# Patient Record
Sex: Female | Born: 1955 | Race: Black or African American | Hispanic: No | Marital: Single | Smoking: Never smoker
Health system: Southern US, Community
[De-identification: ages and names within clinical notes are randomized; demographics above are authoritative.]

## PROBLEM LIST (undated history)

## (undated) DIAGNOSIS — E785 Hyperlipidemia, unspecified: Secondary | ICD-10-CM

## (undated) DIAGNOSIS — E049 Nontoxic goiter, unspecified: Secondary | ICD-10-CM

## (undated) DIAGNOSIS — K219 Gastro-esophageal reflux disease without esophagitis: Secondary | ICD-10-CM

## (undated) DIAGNOSIS — E119 Type 2 diabetes mellitus without complications: Secondary | ICD-10-CM

## (undated) DIAGNOSIS — I10 Essential (primary) hypertension: Secondary | ICD-10-CM

## (undated) HISTORY — PX: PARTIAL HYSTERECTOMY: SHX80

## (undated) HISTORY — DX: Gastro-esophageal reflux disease without esophagitis: K21.9

## (undated) HISTORY — DX: Essential (primary) hypertension: I10

## (undated) HISTORY — DX: Hyperlipidemia, unspecified: E78.5

## (undated) HISTORY — PX: LIPOMA EXCISION: SHX5283

## (undated) HISTORY — DX: Nontoxic goiter, unspecified: E04.9

## (undated) HISTORY — DX: Type 2 diabetes mellitus without complications: E11.9

---

## 1977-08-29 HISTORY — PX: APPENDECTOMY: SHX54

## 1999-06-23 ENCOUNTER — Encounter: Admission: RE | Admit: 1999-06-23 | Discharge: 1999-06-23 | Payer: Self-pay | Admitting: Family Medicine

## 1999-06-23 ENCOUNTER — Encounter: Payer: Self-pay | Admitting: Family Medicine

## 1999-07-19 ENCOUNTER — Encounter: Admission: RE | Admit: 1999-07-19 | Discharge: 1999-07-19 | Payer: Self-pay | Admitting: Family Medicine

## 1999-07-19 ENCOUNTER — Encounter: Payer: Self-pay | Admitting: Family Medicine

## 1999-11-17 ENCOUNTER — Encounter: Payer: Self-pay | Admitting: Otolaryngology

## 1999-11-17 ENCOUNTER — Ambulatory Visit (HOSPITAL_COMMUNITY): Admission: RE | Admit: 1999-11-17 | Discharge: 1999-11-17 | Payer: Self-pay | Admitting: Otolaryngology

## 2000-07-24 ENCOUNTER — Encounter: Payer: Self-pay | Admitting: Family Medicine

## 2000-07-24 ENCOUNTER — Encounter: Admission: RE | Admit: 2000-07-24 | Discharge: 2000-07-24 | Payer: Self-pay | Admitting: Family Medicine

## 2000-09-28 ENCOUNTER — Encounter: Admission: RE | Admit: 2000-09-28 | Discharge: 2000-09-28 | Payer: Self-pay | Admitting: Gastroenterology

## 2000-09-28 ENCOUNTER — Encounter: Payer: Self-pay | Admitting: Gastroenterology

## 2001-01-26 ENCOUNTER — Ambulatory Visit (HOSPITAL_COMMUNITY): Admission: RE | Admit: 2001-01-26 | Discharge: 2001-01-26 | Payer: Self-pay | Admitting: Gastroenterology

## 2001-08-14 ENCOUNTER — Encounter: Admission: RE | Admit: 2001-08-14 | Discharge: 2001-08-14 | Payer: Self-pay | Admitting: Family Medicine

## 2001-08-14 ENCOUNTER — Encounter: Payer: Self-pay | Admitting: Family Medicine

## 2002-09-16 ENCOUNTER — Encounter: Admission: RE | Admit: 2002-09-16 | Discharge: 2002-09-16 | Payer: Self-pay | Admitting: Family Medicine

## 2002-09-16 ENCOUNTER — Encounter: Payer: Self-pay | Admitting: Family Medicine

## 2003-05-19 ENCOUNTER — Encounter: Payer: Self-pay | Admitting: Gastroenterology

## 2003-05-19 ENCOUNTER — Ambulatory Visit (HOSPITAL_COMMUNITY): Admission: RE | Admit: 2003-05-19 | Discharge: 2003-05-19 | Payer: Self-pay | Admitting: Gastroenterology

## 2003-09-23 ENCOUNTER — Other Ambulatory Visit: Admission: RE | Admit: 2003-09-23 | Discharge: 2003-09-23 | Payer: Self-pay | Admitting: Family Medicine

## 2004-01-09 ENCOUNTER — Ambulatory Visit (HOSPITAL_COMMUNITY): Admission: RE | Admit: 2004-01-09 | Discharge: 2004-01-09 | Payer: Self-pay | Admitting: Family Medicine

## 2004-09-20 ENCOUNTER — Encounter: Admission: RE | Admit: 2004-09-20 | Discharge: 2004-09-20 | Payer: Self-pay | Admitting: Family Medicine

## 2004-09-30 ENCOUNTER — Other Ambulatory Visit: Admission: RE | Admit: 2004-09-30 | Discharge: 2004-09-30 | Payer: Self-pay | Admitting: Family Medicine

## 2005-01-06 ENCOUNTER — Encounter (INDEPENDENT_AMBULATORY_CARE_PROVIDER_SITE_OTHER): Payer: Self-pay | Admitting: *Deleted

## 2005-01-06 ENCOUNTER — Ambulatory Visit (HOSPITAL_COMMUNITY): Admission: RE | Admit: 2005-01-06 | Discharge: 2005-01-06 | Payer: Self-pay | Admitting: Obstetrics and Gynecology

## 2005-04-12 ENCOUNTER — Encounter (INDEPENDENT_AMBULATORY_CARE_PROVIDER_SITE_OTHER): Payer: Self-pay | Admitting: *Deleted

## 2005-04-12 ENCOUNTER — Inpatient Hospital Stay (HOSPITAL_COMMUNITY): Admission: RE | Admit: 2005-04-12 | Discharge: 2005-04-14 | Payer: Self-pay | Admitting: Obstetrics and Gynecology

## 2005-12-08 ENCOUNTER — Encounter: Admission: RE | Admit: 2005-12-08 | Discharge: 2005-12-08 | Payer: Self-pay | Admitting: Family Medicine

## 2005-12-28 ENCOUNTER — Encounter: Payer: Self-pay | Admitting: Family Medicine

## 2006-01-03 ENCOUNTER — Encounter: Admission: RE | Admit: 2006-01-03 | Discharge: 2006-01-03 | Payer: Self-pay | Admitting: Family Medicine

## 2006-01-11 ENCOUNTER — Ambulatory Visit (HOSPITAL_COMMUNITY): Admission: RE | Admit: 2006-01-11 | Discharge: 2006-01-11 | Payer: Self-pay | Admitting: Otolaryngology

## 2006-12-11 ENCOUNTER — Encounter: Admission: RE | Admit: 2006-12-11 | Discharge: 2006-12-11 | Payer: Self-pay | Admitting: Family Medicine

## 2007-11-12 ENCOUNTER — Other Ambulatory Visit: Admission: RE | Admit: 2007-11-12 | Discharge: 2007-11-12 | Payer: Self-pay | Admitting: Family Medicine

## 2008-01-10 ENCOUNTER — Encounter: Admission: RE | Admit: 2008-01-10 | Discharge: 2008-01-10 | Payer: Self-pay | Admitting: Family Medicine

## 2009-01-20 ENCOUNTER — Encounter: Admission: RE | Admit: 2009-01-20 | Discharge: 2009-01-20 | Payer: Self-pay | Admitting: Family Medicine

## 2010-02-09 ENCOUNTER — Encounter: Admission: RE | Admit: 2010-02-09 | Discharge: 2010-02-09 | Payer: Self-pay | Admitting: Family Medicine

## 2010-09-19 ENCOUNTER — Encounter: Payer: Self-pay | Admitting: Family Medicine

## 2011-01-14 NOTE — Op Note (Signed)
[name], [name]              ACCOUNT NO.:  [id number]   MEDICAL RECORD NO.:  [id number]          PATIENT TYPE:  AMB   LOCATION:  SDC                           [facility]ILITY:  [facility]   PHYSICIAN:  Charles A. Delcambre, MDDATE OF BIRTH:  [birth year]   DATE OF PROCEDURE:  01/06/2005  DATE OF DISCHARGE:                                 OPERATIVE REPORT   PREOPERATIVE DIAGNOSES:  1.  Irregular menses.  2.  Endometrial polyp.  3.  Endometrial hyperplasia.  4.  Prominent cervix of unclear etiology.   POSTOPERATIVE DIAGNOSES:  1.  Irregular menses.  2.  Endometrial polyp.  3.  Endometrial hyperplasia.  4.  Prominent cervix of unclear etiology.   PROCEDURES:  1.  Paracervical block.  2.  Hysteroscopy.  3.  Dilation and curettage.  4.  Endometrial polypectomy.  5.  Cervical biopsy.   SURGEON:  Charles A. Delcambre, MD   ASSISTANT:  None.   COMPLICATIONS:  None.   ESTIMATED BLOOD LOSS:  25 mL.   SPECIMENS:  1.  Endometrial curettings.  2.  Endometrial polyps.  3.  Endocervical curettings.  4.  Cervical biopsy, 12 o'clock random.   OPERATIVE FINDINGS:  Endocervical polyp, posterior fundus, noted.   Sorbitol loss during the procedure 10 mL.   ANESTHESIA:  Monitored anesthesia care with IV sedation.   DESCRIPTION OF PROCEDURE:  The patient was taken to the operating room and  placed in the supine position.  Anesthesia was dosed and the patient was  then placed in dorsal lithotomy position.  Sterile prep and drape was  undertaken, a weighted speculum was placed in the vagina, the anterior lip  of the cervix was grasped with a single-tooth tenaculum.  A paracervical  block was placed with 0.25% Marcaine plain at 4 and 8 o'clock, 5 mL at each  location.  No evidence of intravascular injection was noted.  Hanks dilators  were used to dilate it up enough to pass a 5 mm scope.  The scope was  passed.  The sound had previously been done prior to dilation.  It was noted  to 9 cm.   There was no evidence of perforation. Under direct visualization,  hysteroscopic findings were noted.  Polyp forceps were used to grasp the  polyps and a polypectomy was done without difficulty.  A moderate amount of  polyp tissue was removed with hysteroscopic guidance.  Following this, a  banjo curette was used without evidence of perforation for endometrial  curettings to be taken with a moderate amount of tissue obtained.  Hemostasis was adequate.  All instruments were removed and a 12 o'clock  random biopsy was done with Kevorkian biopsy forceps.  Monsel's solution was  held, the hemostasis was adequate.  All instruments were removed.  The  patient tolerated the procedure well and was taken to recovery with  physician in attendance, having tolerated her procedure well.      CAD/MEDQ  D:  01/06/2005  T:  01/06/2005  Job:  [id number]

## 2011-01-14 NOTE — Discharge Summary (Signed)
[name], [name]              ACCOUNT NO.:  [id number]   MEDICAL RECORD NO.:  [id number]          PATIENT TYPE:  INP   LOCATION:  9315                          [facility]ACILITY:  [facility]   PHYSICIAN:  Charles A. Delcambre, MDDATE OF BIRTH:  06/03/1956   DATE OF ADMISSION:  04/12/2005  DATE OF DISCHARGE:  04/14/2005                                 DISCHARGE SUMMARY   PRIMARY DISCHARGE DIAGNOSIS:  1.  Complex atypical hyperplasia.  2.  Adenomyosis.   OPERATION/PROCEDURE:  Transabdominal hysterectomy.   DISPOSITION:  The patient discharged home to follow up in the office in 48  hours to discontinue staples.  She was given convalescent instructions and  notify if any temperature greater than 100 degrees, incisional or erythema  or drainage, significant vaginal bleeding, increased pain.  She was to  convalesce at home.  No driving for two weeks.  No bath for two weeks.  Shower okay.  No heavy lifting greater than 25 pounds for one month.   DISCHARGE MEDICATIONS:  1.  Prescription for Percocet 5/325 mg 1-2 p.o. q.4h. p.r.n., #40.  2.  Hemacyte one p.o. daily, #30.   LABORATORY DATA:  Postoperative hematocrit 29.2, hemoglobin 9.7, pathology  returned as noted above diagnoses.   HISTORY AND PHYSICAL:  See hospital chart for dictated history and physical.   HOSPITAL COURSE:  The patient was admitted.  Underwent surgery as noted  above.  Postoperatively the patient had routine postoperative course.  She  had good pain control with PCA.  This was discontinued postoperative day #1  and p.o. medication was given.  Spontaneous return of flatus and toleration  of p.o. diet.  Clear liquids were initially tolerated.  She voided without  difficulty with discontinuation of Foley. Postoperative day #1 she  ambulated, tolerated general diet postoperative day #1 evening.  Continued  to do well postoperative day #2.  Was, therefore, discharged home on  postoperative day #2. Will followup as noted  above.      Charles A. Sydnee Cabal, MD  Electronically Signed     CAD/MEDQ  D:  05/14/2005  T:  05/14/2005  Job:  [id number]

## 2011-01-14 NOTE — H&P (Signed)
[name], [name]              ACCOUNT NO.:  [id number]   MEDICAL RECORD NO.:  [id number]           PATIENT TYPE:   LOCATION:                                 FACILITY:   PHYSICIAN:  Charles A. Delcambre, MDDATE OF BIRTH:  1956-02-15   DATE OF ADMISSION:  01/07/2004  DATE OF DISCHARGE:                                HISTORY & PHYSICAL   REASON FOR ADMISSION:  The patient is being admitted for hysteroscopy D&C  secondary to simple hyperplasia without atypia, to undergo hysteroscopy D&C.  She is a 55 year old para [id number], refusing further information.  LMP in December 2005.  Amenorrheic since that time.  Endometrial biopsy did  show focal simple hyperplasia without atypia.  Sonohystogram was obtained,  with the posterior with suggestion of a polyp noted with endometrial  thickness of 1.0 cm.   PAST MEDICAL HISTORY:  1.  Hypertension.  2.  GERD.   SURGICAL HISTORY:  1.  Lipoma removal.  2.  Appendix removal.   MEDICATIONS:  1.  Diovan hydrochlorothiazide 25 mg.  2.  Toprol-XL.  (Once a day each; otherwise, not specified doses.).   ALLERGIES:  No known drug allergies.   SOCIAL HISTORY:  No tobacco, ethanol, or drug use.  She is not married, not  sexually active.   FAMILY HISTORY:  Hypertension in father, mother, and 2 sisters.  One of the  sisters has kidney failure.   REVIEW OF SYSTEMS:  No fever, chills, rashes, lesions, headaches, dizziness,  seasonal allergies, chest pain, shortness of breath, wheezing, diarrhea,  constipation, bleeding, melena, hematochezia, urgency, frequency, dysuria,  incontinence, hematuria, __________ changes.   PHYSICAL EXAMINATION:  VITAL SIGNS:  Blood pressure 140/90, heart rate 60,  weight 199, respirations 18.  HEENT:  Grossly within normal limits.  NECK:  Supple without thyromegaly or adenopathy.  LUNGS:  Clear bilaterally.  HEART:  Regular rate and rhythm.  No murmurs, rubs, or gallops.  BREASTS:  Symmetrical.  The  remainder of the exam deferred, with normal exam  per primary care physician within the last 6 months, per patient.  ABDOMEN:  Soft, nontender.  PELVIC:  Normal external female genitalia.  Bartholin's urethra, Skene's  glands within normal limits.  Vault without discharge or lesions.  Uterus  not grossly enlarged.  Adnexal indeterminate secondary to patient's body  habitus.  I cannot detect ovaries directly.  The exam was nontender without  masses, however.   ASSESSMENT:  Irregular menses.  Simple hyperplasia on biopsy.   PLAN:  Hysteroscopy D&C.  CBC, Chem-7.  The patient was given the risks of  infection, bleeding, uterine perforation, bowel and bladder damage, blood  product risks including hepatitis and HIV exposure.  All questions were  answered.  Take blood pressure medications the morning of admission with sip  of water.  We will proceed as outlined.  Urine pregnancy test will be done  the morning of admission, as well.      CAD/MEDQ  D:  01/03/2005  T:  01/03/2005  Job:  [id number]

## 2011-01-14 NOTE — Op Note (Signed)
[name], [name]              ACCOUNT NO.:  [id number]   MEDICAL RECORD NO.:  [id number]          PATIENT TYPE:  INP   LOCATION:  9399                          [facility]ILITY[facility]  [facility]   PHYSICIAN:  Charles A. Delcambre, MDDATE OF BIRTH:  05/22/1956   DATE OF PROCEDURE:  04/12/2005  DATE OF DISCHARGE:                                 OPERATIVE REPORT   PREOPERATIVE DIAGNOSIS:  Complex endometrial hyperplasia.   POSTOPERATIVE DIAGNOSIS:  Complex endometrial hyperplasia.   PROCEDURE:  Transabdominal hysterectomy.   SURGEON:  Charles A. Sydnee Cabal, M.D.   ASSISTANT:  Rudy Jew. Ashley Royalty, M.D.   ANESTHESIA:  General by the endotracheal route.   ESTIMATED BLOOD LOSS:  500 mL.   SPECIMENS:  Uterus to pathology.   FINDINGS:  Normal tubes, ovaries, and uterus, and pelvis.  No external  lesions from the uterus grossly.   COUNTS:  Needle, sponge, and instrument counts correct x2.   DESCRIPTION OF PROCEDURE:  The patient was taken to the operating room and  placed in the supine position.  General anesthesia was induced without  difficulty and was adequate.  Sterile prep and drape was undertaken.  A  Pfannenstiel incision was made with the knife and taken down to the fascia.  The fascia was incised with the knife and Mayo scissors.  Rectus sheath was  released superiorly and inferiorly sharply.  Peritoneum was entered with the  Hemostat.  The rectus muscles were dissected in the midline.  The peritoneal  incision was taken superiorly and inferiorly sharply without damage to the  bowel or bladder.  Balfour retractor was placed in the abdomen for exposure.  Moistened laps were used to pack the bowel out of the pelvis. Cornual  regions were grasped with Kelly clamps.  Round ligaments were transected,  and transfixion stitched, and held.  The vesicouterine peritoneum was  incised across the lower uterine segment with Metzenbaum scissors and a  sponge stick was used to develop the bladder  development off the lower  uterine segment.  Utero-ovarian pedicles were isolated on either side, cross-  clamped, free tied, and then transfixion stitched with 0 Vicryl.  The  remaining pedicles were taken on either side with straight Heaney clamps,  transfixion stitched with good hemostasis.  Hemostasis was excellent and the  final views of the pedicles included the uterosacral ligaments and were  held.  Cervix was amputated from the vagina.  Richardson angle sutures were  placed on either angle with 0 Vicryl.  A 0 Vicryl running suture was then  placed in a locking fashion to close the vaginal cuff.  Hemostasis was  verified to be excellent.  Minor electrocautery was used on the anterior  cuff edge.  Several figure-of-eight 2-0 Vicryl sutures were used at the left  uterosacral ligament area at the vaginal angle and at an area near the  bladder as an anterior vaginal mucosa as well.  I could not detect any  bladder injury and did not feel the suture went into the bladder.  At one  point there was some significant bleeding from the peritoneal window just  medial  to the utero-ovarian pedicle on the right.  This was clamped with a  right angle and transfixion stitched with 2-0 Vicryl with good hemostasis  resulting.  Irrigation was carried out.  Hemostasis was verified.  All laps  and retractors were removed.  Subfascial hemostasis was excellent.  The  fascia was closed with #1 Vicryl running nonlocking suture.  Subcutaneous 2-  0 Vicryl interrupted suture was used to close the subcutaneous layer after  irrigation was carried out with minor electrocautery yielding good  hemostasis.  Sterile skin clips were used to close the skin.  The patient  tolerated the procedure well and was taken to the recovery room after  dressing was applied with the physician in attendance.      Charles A. Sydnee Cabal, MD  Electronically Signed     CAD/MEDQ  D:  04/12/2005  T:  04/12/2005  Job:  [id number]

## 2011-01-14 NOTE — H&P (Signed)
[name], [name]              ACCOUNT NO.:  [id number]   MEDICAL RECORD NO.:  [id number]          PATIENT TYPE:  INP   LOCATION:  NA                            [facility]   PHYSICIAN:  Charles A. Delcambre, MDDATE OF BIRTH:  May 22, 1956   DATE OF ADMISSION:  04/12/2005  DATE OF DISCHARGE:                                HISTORY & PHYSICAL   Patient is to be admitted on 04/12/2005 to undergo transabdominal  hysterectomy, possible bilateral salpingo-oophorectomy, but wishing to  conserve ovaries if possible.   INDICATION FOR SURGERY:  Complex endometrial hyperplasia with abnormal  uterine bleeding.  She has informed consent.  Discussed risks of infection,  bleeding, bowel or bladder damage, blood product risks including hepatitis  and HIV exposure, and DVT risks, incision cellulitis and infection, ureteral  damage.  All questions are answered.  She gives informed consent.   PAST MEDICAL HISTORY:  1.  Hypertension.  2.  GERD.   PAST SURGICAL HISTORY:  1.  Lipoma removal.  2.  Appendectomy.  3.  Hysteroscopy/D&C.   MEDICATIONS:  1.  Diovan/hydrochlorothiazide 25 mg once a day.  2.  Toprol XL.  3.  Nexium dose not specified.   ALLERGIES:  No known drug allergies.   SOCIAL HISTORY:  Denies tobacco, ethanol, or drug use or STD exposure in the  past.  She is not married, currently not sexually active.   FAMILY HISTORY:  Father is 39, mother age 83.  Each with hypertension.  Sister 50, sister age 46, each with hypertension.  Second sister with kidney  failure, sister age 69 in good health.   REVIEW OF SYSTEMS:  Cervical polyps, endometrial polyps, irregular menses,  and endometrial hyperplasia, complex, as noted above.  Otherwise, denies  fever, chills, nausea, vomiting, diarrhea, constipation, headaches, bleeding  per rectum, galactorrhea, emotional changes, urgency, frequency, dysuria,  hematuria.   PHYSICAL EXAMINATION:  GENERAL:  Alert and oriented x3.  No  distress.  VITAL SIGNS:  Blood pressure 140/90, heart rate 66, weight 198 pounds.  HEENT:  Grossly within normal limits.  NECK:  Supple without thyromegaly or adenopathy.  LUNGS:  Clear bilaterally.  BACK:  No CVAT.  Vertebral column nontender to palpation.  HEART:  Regular rate and rhythm without murmurs, rubs, or gallops.  BREASTS:  No masses, tenderness, or discharge.  SKIN:  No nipple change bilaterally.  ABDOMEN:  Moderate obesity present.  Appendectomy scar is noted.  No  hepatosplenomegaly or other masses noted.  PELVIC:  Normal external female genitalia.  Bartholin's, urethral, and  Skene's within normal limits.  Vault without discharge or lesions.  Multiparous cervix is noted.  Vagina is normal.  Bimanual examination:  Uterus not grossly enlarged.  Adnexa nontender without masses bilaterally.  Ovaries difficult to palpate secondary to patient's body habitus.   ASSESSMENT:  1.  Endometrial hyperplasia, complex.  2.  Irregular bleeding.   PLAN:  Transabdominal hysterectomy, bilateral salpingo-oophorectomy only if  indicated by surgical findings.  Patient prefers ovarian conservation.  Informed consent and counseling as noted above.  Patient gives informed  consent with regard to ovarian conservation,  possible increased risk of  ovarian cancer later in life.  All questions are answered.  Preoperative CBC  and chem-20, type and screen, PAS, incentive spirometry, and SCD's are all  ordered.  We will use Cefotan 1 g IV on-call to the OR.  All questions are  answered.  We will proceed as outlined.       CAD/MEDQ  D:  04/04/2005  T:  04/04/2005  Job:  [id number]

## 2011-02-28 ENCOUNTER — Other Ambulatory Visit: Payer: Self-pay | Admitting: Family Medicine

## 2011-02-28 DIAGNOSIS — Z1231 Encounter for screening mammogram for malignant neoplasm of breast: Secondary | ICD-10-CM

## 2011-03-16 ENCOUNTER — Ambulatory Visit
Admission: RE | Admit: 2011-03-16 | Discharge: 2011-03-16 | Disposition: A | Discharge status: Home / Self Care | Payer: Federal, State, Local not specified - PPO | Source: Ambulatory Visit | Attending: Family Medicine | Admitting: Family Medicine

## 2011-03-16 DIAGNOSIS — Z1231 Encounter for screening mammogram for malignant neoplasm of breast: Secondary | ICD-10-CM

## 2012-02-15 ENCOUNTER — Other Ambulatory Visit: Payer: Self-pay | Admitting: Family Medicine

## 2012-02-15 DIAGNOSIS — Z1231 Encounter for screening mammogram for malignant neoplasm of breast: Secondary | ICD-10-CM

## 2012-03-20 ENCOUNTER — Ambulatory Visit
Admission: RE | Admit: 2012-03-20 | Discharge: 2012-03-20 | Disposition: A | Discharge status: Home / Self Care | Payer: Federal, State, Local not specified - PPO | Source: Ambulatory Visit | Attending: Family Medicine | Admitting: Family Medicine

## 2012-03-20 DIAGNOSIS — Z1231 Encounter for screening mammogram for malignant neoplasm of breast: Secondary | ICD-10-CM

## 2013-02-11 ENCOUNTER — Other Ambulatory Visit: Payer: Self-pay

## 2013-02-11 DIAGNOSIS — Z1231 Encounter for screening mammogram for malignant neoplasm of breast: Secondary | ICD-10-CM

## 2013-03-21 ENCOUNTER — Ambulatory Visit
Admission: RE | Admit: 2013-03-21 | Discharge: 2013-03-21 | Disposition: A | Discharge status: Home / Self Care | Payer: Federal, State, Local not specified - PPO | Source: Ambulatory Visit

## 2013-03-21 ENCOUNTER — Ambulatory Visit: Payer: Federal, State, Local not specified - PPO

## 2013-03-21 DIAGNOSIS — Z1231 Encounter for screening mammogram for malignant neoplasm of breast: Secondary | ICD-10-CM

## 2014-02-18 ENCOUNTER — Other Ambulatory Visit: Payer: Self-pay

## 2014-02-18 DIAGNOSIS — Z1231 Encounter for screening mammogram for malignant neoplasm of breast: Secondary | ICD-10-CM

## 2014-03-26 ENCOUNTER — Ambulatory Visit
Admission: RE | Admit: 2014-03-26 | Discharge: 2014-03-26 | Disposition: A | Discharge status: Home / Self Care | Payer: Federal, State, Local not specified - PPO | Source: Ambulatory Visit

## 2014-03-26 ENCOUNTER — Encounter (INDEPENDENT_AMBULATORY_CARE_PROVIDER_SITE_OTHER): Payer: Self-pay

## 2014-03-26 DIAGNOSIS — Z1231 Encounter for screening mammogram for malignant neoplasm of breast: Secondary | ICD-10-CM

## 2015-03-13 ENCOUNTER — Other Ambulatory Visit: Payer: Self-pay

## 2015-03-13 DIAGNOSIS — Z1231 Encounter for screening mammogram for malignant neoplasm of breast: Secondary | ICD-10-CM

## 2015-04-02 ENCOUNTER — Ambulatory Visit
Admission: RE | Admit: 2015-04-02 | Discharge: 2015-04-02 | Disposition: A | Discharge status: Home / Self Care | Payer: Federal, State, Local not specified - PPO | Source: Ambulatory Visit

## 2015-04-02 DIAGNOSIS — Z1231 Encounter for screening mammogram for malignant neoplasm of breast: Secondary | ICD-10-CM

## 2016-03-14 ENCOUNTER — Other Ambulatory Visit: Payer: Self-pay | Admitting: Family Medicine

## 2016-03-14 DIAGNOSIS — Z1231 Encounter for screening mammogram for malignant neoplasm of breast: Secondary | ICD-10-CM

## 2016-04-06 DIAGNOSIS — K080 Exfoliation of teeth due to systemic causes: Secondary | ICD-10-CM | POA: Diagnosis not present

## 2016-04-11 DIAGNOSIS — E7800 Pure hypercholesterolemia, unspecified: Secondary | ICD-10-CM | POA: Diagnosis not present

## 2016-04-11 DIAGNOSIS — E042 Nontoxic multinodular goiter: Secondary | ICD-10-CM | POA: Diagnosis not present

## 2016-04-11 DIAGNOSIS — E119 Type 2 diabetes mellitus without complications: Secondary | ICD-10-CM | POA: Diagnosis not present

## 2016-04-11 DIAGNOSIS — I10 Essential (primary) hypertension: Secondary | ICD-10-CM | POA: Diagnosis not present

## 2016-04-12 ENCOUNTER — Ambulatory Visit: Payer: Federal, State, Local not specified - PPO

## 2016-04-20 ENCOUNTER — Ambulatory Visit: Payer: Federal, State, Local not specified - PPO

## 2016-04-22 ENCOUNTER — Ambulatory Visit: Payer: Federal, State, Local not specified - PPO

## 2016-05-16 ENCOUNTER — Ambulatory Visit
Admission: RE | Admit: 2016-05-16 | Discharge: 2016-05-16 | Disposition: A | Discharge status: Home / Self Care | Payer: Federal, State, Local not specified - PPO | Source: Ambulatory Visit | Attending: Family Medicine | Admitting: Family Medicine

## 2016-05-16 DIAGNOSIS — Z1231 Encounter for screening mammogram for malignant neoplasm of breast: Secondary | ICD-10-CM

## 2016-07-08 DIAGNOSIS — E7800 Pure hypercholesterolemia, unspecified: Secondary | ICD-10-CM | POA: Diagnosis not present

## 2016-10-17 DIAGNOSIS — E042 Nontoxic multinodular goiter: Secondary | ICD-10-CM | POA: Diagnosis not present

## 2016-10-17 DIAGNOSIS — E119 Type 2 diabetes mellitus without complications: Secondary | ICD-10-CM | POA: Diagnosis not present

## 2016-10-17 DIAGNOSIS — I10 Essential (primary) hypertension: Secondary | ICD-10-CM | POA: Diagnosis not present

## 2016-10-17 DIAGNOSIS — E7800 Pure hypercholesterolemia, unspecified: Secondary | ICD-10-CM | POA: Diagnosis not present

## 2017-04-14 DIAGNOSIS — E119 Type 2 diabetes mellitus without complications: Secondary | ICD-10-CM | POA: Diagnosis not present

## 2017-04-14 DIAGNOSIS — I10 Essential (primary) hypertension: Secondary | ICD-10-CM | POA: Diagnosis not present

## 2017-04-14 DIAGNOSIS — E042 Nontoxic multinodular goiter: Secondary | ICD-10-CM | POA: Diagnosis not present

## 2017-04-14 DIAGNOSIS — E7800 Pure hypercholesterolemia, unspecified: Secondary | ICD-10-CM | POA: Diagnosis not present

## 2017-04-17 ENCOUNTER — Other Ambulatory Visit: Payer: Self-pay | Admitting: Family Medicine

## 2017-04-17 DIAGNOSIS — Z1231 Encounter for screening mammogram for malignant neoplasm of breast: Secondary | ICD-10-CM

## 2017-04-18 DIAGNOSIS — K080 Exfoliation of teeth due to systemic causes: Secondary | ICD-10-CM | POA: Diagnosis not present

## 2017-05-17 ENCOUNTER — Ambulatory Visit
Admission: RE | Admit: 2017-05-17 | Discharge: 2017-05-17 | Disposition: A | Discharge status: Home / Self Care | Payer: Federal, State, Local not specified - PPO | Source: Ambulatory Visit | Attending: Family Medicine | Admitting: Family Medicine

## 2017-05-17 DIAGNOSIS — Z1231 Encounter for screening mammogram for malignant neoplasm of breast: Secondary | ICD-10-CM

## 2017-06-23 DIAGNOSIS — L02211 Cutaneous abscess of abdominal wall: Secondary | ICD-10-CM | POA: Diagnosis not present

## 2017-10-24 DIAGNOSIS — K080 Exfoliation of teeth due to systemic causes: Secondary | ICD-10-CM | POA: Diagnosis not present

## 2017-11-24 DIAGNOSIS — I10 Essential (primary) hypertension: Secondary | ICD-10-CM | POA: Diagnosis not present

## 2017-11-24 DIAGNOSIS — E7800 Pure hypercholesterolemia, unspecified: Secondary | ICD-10-CM | POA: Diagnosis not present

## 2017-11-24 DIAGNOSIS — K219 Gastro-esophageal reflux disease without esophagitis: Secondary | ICD-10-CM | POA: Diagnosis not present

## 2017-11-24 DIAGNOSIS — E119 Type 2 diabetes mellitus without complications: Secondary | ICD-10-CM | POA: Diagnosis not present

## 2018-03-21 DIAGNOSIS — H903 Sensorineural hearing loss, bilateral: Secondary | ICD-10-CM | POA: Diagnosis not present

## 2018-04-04 DIAGNOSIS — Z1211 Encounter for screening for malignant neoplasm of colon: Secondary | ICD-10-CM | POA: Diagnosis not present

## 2018-04-04 DIAGNOSIS — D126 Benign neoplasm of colon, unspecified: Secondary | ICD-10-CM | POA: Diagnosis not present

## 2018-04-04 DIAGNOSIS — K635 Polyp of colon: Secondary | ICD-10-CM | POA: Diagnosis not present

## 2018-04-06 DIAGNOSIS — K635 Polyp of colon: Secondary | ICD-10-CM | POA: Diagnosis not present

## 2018-04-06 DIAGNOSIS — Z1211 Encounter for screening for malignant neoplasm of colon: Secondary | ICD-10-CM | POA: Diagnosis not present

## 2018-04-06 DIAGNOSIS — D126 Benign neoplasm of colon, unspecified: Secondary | ICD-10-CM | POA: Diagnosis not present

## 2018-04-10 ENCOUNTER — Other Ambulatory Visit: Payer: Self-pay | Admitting: Family Medicine

## 2018-04-10 DIAGNOSIS — Z1231 Encounter for screening mammogram for malignant neoplasm of breast: Secondary | ICD-10-CM

## 2018-05-02 DIAGNOSIS — K080 Exfoliation of teeth due to systemic causes: Secondary | ICD-10-CM | POA: Diagnosis not present

## 2018-05-17 DIAGNOSIS — M9902 Segmental and somatic dysfunction of thoracic region: Secondary | ICD-10-CM | POA: Diagnosis not present

## 2018-05-17 DIAGNOSIS — M546 Pain in thoracic spine: Secondary | ICD-10-CM | POA: Diagnosis not present

## 2018-05-17 DIAGNOSIS — R293 Abnormal posture: Secondary | ICD-10-CM | POA: Diagnosis not present

## 2018-05-17 DIAGNOSIS — M2560 Stiffness of unspecified joint, not elsewhere classified: Secondary | ICD-10-CM | POA: Diagnosis not present

## 2018-05-22 ENCOUNTER — Ambulatory Visit
Admission: RE | Admit: 2018-05-22 | Discharge: 2018-05-22 | Disposition: A | Discharge status: Home / Self Care | Payer: Federal, State, Local not specified - PPO | Source: Ambulatory Visit | Attending: Family Medicine | Admitting: Family Medicine

## 2018-05-22 DIAGNOSIS — Z1231 Encounter for screening mammogram for malignant neoplasm of breast: Secondary | ICD-10-CM | POA: Diagnosis not present

## 2018-05-28 DIAGNOSIS — M546 Pain in thoracic spine: Secondary | ICD-10-CM | POA: Diagnosis not present

## 2018-05-28 DIAGNOSIS — M2560 Stiffness of unspecified joint, not elsewhere classified: Secondary | ICD-10-CM | POA: Diagnosis not present

## 2018-05-28 DIAGNOSIS — M9902 Segmental and somatic dysfunction of thoracic region: Secondary | ICD-10-CM | POA: Diagnosis not present

## 2018-05-28 DIAGNOSIS — R293 Abnormal posture: Secondary | ICD-10-CM | POA: Diagnosis not present

## 2018-06-01 DIAGNOSIS — I10 Essential (primary) hypertension: Secondary | ICD-10-CM | POA: Diagnosis not present

## 2018-06-01 DIAGNOSIS — E7800 Pure hypercholesterolemia, unspecified: Secondary | ICD-10-CM | POA: Diagnosis not present

## 2018-06-01 DIAGNOSIS — E042 Nontoxic multinodular goiter: Secondary | ICD-10-CM | POA: Diagnosis not present

## 2018-06-01 DIAGNOSIS — E119 Type 2 diabetes mellitus without complications: Secondary | ICD-10-CM | POA: Diagnosis not present

## 2018-06-11 DIAGNOSIS — M546 Pain in thoracic spine: Secondary | ICD-10-CM | POA: Diagnosis not present

## 2018-06-11 DIAGNOSIS — R293 Abnormal posture: Secondary | ICD-10-CM | POA: Diagnosis not present

## 2018-06-11 DIAGNOSIS — M2560 Stiffness of unspecified joint, not elsewhere classified: Secondary | ICD-10-CM | POA: Diagnosis not present

## 2018-06-11 DIAGNOSIS — M9902 Segmental and somatic dysfunction of thoracic region: Secondary | ICD-10-CM | POA: Diagnosis not present

## 2018-06-12 DIAGNOSIS — K080 Exfoliation of teeth due to systemic causes: Secondary | ICD-10-CM | POA: Diagnosis not present

## 2018-06-13 DIAGNOSIS — R293 Abnormal posture: Secondary | ICD-10-CM | POA: Diagnosis not present

## 2018-06-13 DIAGNOSIS — M2560 Stiffness of unspecified joint, not elsewhere classified: Secondary | ICD-10-CM | POA: Diagnosis not present

## 2018-06-13 DIAGNOSIS — M546 Pain in thoracic spine: Secondary | ICD-10-CM | POA: Diagnosis not present

## 2018-06-13 DIAGNOSIS — M9902 Segmental and somatic dysfunction of thoracic region: Secondary | ICD-10-CM | POA: Diagnosis not present

## 2018-06-15 DIAGNOSIS — M2560 Stiffness of unspecified joint, not elsewhere classified: Secondary | ICD-10-CM | POA: Diagnosis not present

## 2018-06-15 DIAGNOSIS — R293 Abnormal posture: Secondary | ICD-10-CM | POA: Diagnosis not present

## 2018-06-15 DIAGNOSIS — M9902 Segmental and somatic dysfunction of thoracic region: Secondary | ICD-10-CM | POA: Diagnosis not present

## 2018-06-15 DIAGNOSIS — M546 Pain in thoracic spine: Secondary | ICD-10-CM | POA: Diagnosis not present

## 2018-06-18 DIAGNOSIS — M9902 Segmental and somatic dysfunction of thoracic region: Secondary | ICD-10-CM | POA: Diagnosis not present

## 2018-06-18 DIAGNOSIS — M546 Pain in thoracic spine: Secondary | ICD-10-CM | POA: Diagnosis not present

## 2018-06-18 DIAGNOSIS — R293 Abnormal posture: Secondary | ICD-10-CM | POA: Diagnosis not present

## 2018-06-18 DIAGNOSIS — M2560 Stiffness of unspecified joint, not elsewhere classified: Secondary | ICD-10-CM | POA: Diagnosis not present

## 2018-06-20 DIAGNOSIS — R293 Abnormal posture: Secondary | ICD-10-CM | POA: Diagnosis not present

## 2018-06-20 DIAGNOSIS — M546 Pain in thoracic spine: Secondary | ICD-10-CM | POA: Diagnosis not present

## 2018-06-20 DIAGNOSIS — M9902 Segmental and somatic dysfunction of thoracic region: Secondary | ICD-10-CM | POA: Diagnosis not present

## 2018-06-20 DIAGNOSIS — M2560 Stiffness of unspecified joint, not elsewhere classified: Secondary | ICD-10-CM | POA: Diagnosis not present

## 2018-07-04 DIAGNOSIS — J343 Hypertrophy of nasal turbinates: Secondary | ICD-10-CM | POA: Diagnosis not present

## 2018-07-04 DIAGNOSIS — J342 Deviated nasal septum: Secondary | ICD-10-CM | POA: Diagnosis not present

## 2018-07-04 DIAGNOSIS — J310 Chronic rhinitis: Secondary | ICD-10-CM | POA: Diagnosis not present

## 2018-08-08 DIAGNOSIS — K080 Exfoliation of teeth due to systemic causes: Secondary | ICD-10-CM | POA: Diagnosis not present

## 2018-12-14 DIAGNOSIS — E042 Nontoxic multinodular goiter: Secondary | ICD-10-CM | POA: Diagnosis not present

## 2018-12-14 DIAGNOSIS — E119 Type 2 diabetes mellitus without complications: Secondary | ICD-10-CM | POA: Diagnosis not present

## 2018-12-14 DIAGNOSIS — I10 Essential (primary) hypertension: Secondary | ICD-10-CM | POA: Diagnosis not present

## 2018-12-14 DIAGNOSIS — E7800 Pure hypercholesterolemia, unspecified: Secondary | ICD-10-CM | POA: Diagnosis not present

## 2018-12-27 DIAGNOSIS — E559 Vitamin D deficiency, unspecified: Secondary | ICD-10-CM | POA: Diagnosis not present

## 2018-12-27 DIAGNOSIS — I10 Essential (primary) hypertension: Secondary | ICD-10-CM | POA: Diagnosis not present

## 2018-12-27 DIAGNOSIS — E119 Type 2 diabetes mellitus without complications: Secondary | ICD-10-CM | POA: Diagnosis not present

## 2018-12-27 DIAGNOSIS — E7800 Pure hypercholesterolemia, unspecified: Secondary | ICD-10-CM | POA: Diagnosis not present

## 2019-04-19 ENCOUNTER — Other Ambulatory Visit: Payer: Self-pay | Admitting: Family Medicine

## 2019-04-19 DIAGNOSIS — Z1231 Encounter for screening mammogram for malignant neoplasm of breast: Secondary | ICD-10-CM

## 2019-06-13 ENCOUNTER — Ambulatory Visit: Payer: Federal, State, Local not specified - PPO

## 2019-06-13 DIAGNOSIS — E7800 Pure hypercholesterolemia, unspecified: Secondary | ICD-10-CM | POA: Diagnosis not present

## 2019-06-13 DIAGNOSIS — I10 Essential (primary) hypertension: Secondary | ICD-10-CM | POA: Diagnosis not present

## 2019-06-13 DIAGNOSIS — E042 Nontoxic multinodular goiter: Secondary | ICD-10-CM | POA: Diagnosis not present

## 2019-06-13 DIAGNOSIS — E119 Type 2 diabetes mellitus without complications: Secondary | ICD-10-CM | POA: Diagnosis not present

## 2019-06-25 ENCOUNTER — Ambulatory Visit
Admission: RE | Admit: 2019-06-25 | Discharge: 2019-06-25 | Disposition: A | Discharge status: Home / Self Care | Payer: Federal, State, Local not specified - PPO | Source: Ambulatory Visit | Attending: Family Medicine | Admitting: Family Medicine

## 2019-06-25 ENCOUNTER — Other Ambulatory Visit: Payer: Self-pay

## 2019-06-25 DIAGNOSIS — Z1231 Encounter for screening mammogram for malignant neoplasm of breast: Secondary | ICD-10-CM

## 2019-06-27 DIAGNOSIS — E119 Type 2 diabetes mellitus without complications: Secondary | ICD-10-CM | POA: Diagnosis not present

## 2019-12-03 DIAGNOSIS — E119 Type 2 diabetes mellitus without complications: Secondary | ICD-10-CM | POA: Diagnosis not present

## 2019-12-03 DIAGNOSIS — E042 Nontoxic multinodular goiter: Secondary | ICD-10-CM | POA: Diagnosis not present

## 2019-12-03 DIAGNOSIS — E7800 Pure hypercholesterolemia, unspecified: Secondary | ICD-10-CM | POA: Diagnosis not present

## 2019-12-03 DIAGNOSIS — I10 Essential (primary) hypertension: Secondary | ICD-10-CM | POA: Diagnosis not present

## 2020-05-25 ENCOUNTER — Other Ambulatory Visit: Payer: Self-pay | Admitting: Family Medicine

## 2020-05-25 DIAGNOSIS — Z1231 Encounter for screening mammogram for malignant neoplasm of breast: Secondary | ICD-10-CM

## 2020-06-05 DIAGNOSIS — I10 Essential (primary) hypertension: Secondary | ICD-10-CM | POA: Diagnosis not present

## 2020-06-05 DIAGNOSIS — K2100 Gastro-esophageal reflux disease with esophagitis, without bleeding: Secondary | ICD-10-CM | POA: Diagnosis not present

## 2020-06-05 DIAGNOSIS — E119 Type 2 diabetes mellitus without complications: Secondary | ICD-10-CM | POA: Diagnosis not present

## 2020-06-05 DIAGNOSIS — E7800 Pure hypercholesterolemia, unspecified: Secondary | ICD-10-CM | POA: Diagnosis not present

## 2020-06-25 ENCOUNTER — Ambulatory Visit: Payer: Federal, State, Local not specified - PPO

## 2020-07-30 ENCOUNTER — Other Ambulatory Visit: Payer: Self-pay

## 2020-07-30 ENCOUNTER — Ambulatory Visit
Admission: RE | Admit: 2020-07-30 | Discharge: 2020-07-30 | Disposition: A | Discharge status: Home / Self Care | Payer: Federal, State, Local not specified - PPO | Source: Ambulatory Visit | Attending: Family Medicine | Admitting: Family Medicine

## 2020-07-30 DIAGNOSIS — Z1231 Encounter for screening mammogram for malignant neoplasm of breast: Secondary | ICD-10-CM | POA: Diagnosis not present

## 2020-12-10 DIAGNOSIS — E7800 Pure hypercholesterolemia, unspecified: Secondary | ICD-10-CM | POA: Diagnosis not present

## 2020-12-10 DIAGNOSIS — E119 Type 2 diabetes mellitus without complications: Secondary | ICD-10-CM | POA: Diagnosis not present

## 2020-12-10 DIAGNOSIS — E042 Nontoxic multinodular goiter: Secondary | ICD-10-CM | POA: Diagnosis not present

## 2020-12-10 DIAGNOSIS — I10 Essential (primary) hypertension: Secondary | ICD-10-CM | POA: Diagnosis not present

## 2020-12-17 ENCOUNTER — Telehealth: Payer: Self-pay | Admitting: Physician Assistant

## 2020-12-17 NOTE — Telephone Encounter (Signed)
Received a new hem referral from Dr. Tiburcio Pea for decreased wbc. Ms. bora has been cld and scheduled to see Karena Addison on 4/27 at 2pm. Pt aware to arrive 20 minutes early.

## 2020-12-23 ENCOUNTER — Encounter: Payer: Self-pay | Admitting: Physician Assistant

## 2020-12-23 ENCOUNTER — Other Ambulatory Visit: Payer: Self-pay

## 2020-12-23 ENCOUNTER — Inpatient Hospital Stay: Payer: Federal, State, Local not specified - PPO | Attending: Physician Assistant | Admitting: Physician Assistant

## 2020-12-23 ENCOUNTER — Inpatient Hospital Stay: Payer: Federal, State, Local not specified - PPO

## 2020-12-23 DIAGNOSIS — R0602 Shortness of breath: Secondary | ICD-10-CM | POA: Diagnosis not present

## 2020-12-23 DIAGNOSIS — E119 Type 2 diabetes mellitus without complications: Secondary | ICD-10-CM | POA: Diagnosis not present

## 2020-12-23 DIAGNOSIS — D709 Neutropenia, unspecified: Secondary | ICD-10-CM | POA: Insufficient documentation

## 2020-12-23 DIAGNOSIS — K219 Gastro-esophageal reflux disease without esophagitis: Secondary | ICD-10-CM | POA: Diagnosis not present

## 2020-12-23 DIAGNOSIS — D708 Other neutropenia: Secondary | ICD-10-CM | POA: Diagnosis not present

## 2020-12-23 DIAGNOSIS — I10 Essential (primary) hypertension: Secondary | ICD-10-CM | POA: Diagnosis not present

## 2020-12-23 DIAGNOSIS — E785 Hyperlipidemia, unspecified: Secondary | ICD-10-CM | POA: Diagnosis not present

## 2020-12-23 LAB — CMP (CANCER CENTER ONLY)
ALT: 20 U/L (ref 0–44)
AST: 24 U/L (ref 15–41)
Albumin: 4.6 g/dL (ref 3.5–5.0)
Alkaline Phosphatase: 43 U/L (ref 38–126)
Anion gap: 11 (ref 5–15)
BUN: 24 mg/dL — ABNORMAL HIGH (ref 8–23)
CO2: 25 mmol/L (ref 22–32)
Calcium: 9.8 mg/dL (ref 8.9–10.3)
Chloride: 105 mmol/L (ref 98–111)
Creatinine: 1 mg/dL (ref 0.44–1.00)
GFR, Estimated: >60 mL/min (ref >60)
Glucose, Bld: 102 mg/dL — ABNORMAL HIGH (ref 70–99)
Potassium: 4.3 mmol/L (ref 3.5–5.1)
Sodium: 141 mmol/L (ref 135–145)
Total Bilirubin: 0.7 mg/dL (ref 0.3–1.2)
Total Protein: 8.6 g/dL — ABNORMAL HIGH (ref 6.5–8.1)

## 2020-12-23 LAB — CBC WITH DIFFERENTIAL (CANCER CENTER ONLY)
Abs Immature Granulocytes: 0.01 10*3/uL (ref 0.00–0.07)
Basophils Absolute: 0 10*3/uL (ref 0.0–0.1)
Basophils Relative: 1 %
Eosinophils Absolute: 0.1 10*3/uL (ref 0.0–0.5)
Eosinophils Relative: 3 %
HCT: 43.8 % (ref 36.0–46.0)
Hemoglobin: 14.9 g/dL (ref 12.0–15.0)
Immature Granulocytes: 0 %
Lymphocytes Relative: 47 %
Lymphs Abs: 1.5 10*3/uL (ref 0.7–4.0)
MCH: 29.7 pg (ref 26.0–34.0)
MCHC: 34 g/dL (ref 30.0–36.0)
MCV: 87.3 fL (ref 80.0–100.0)
Monocytes Absolute: 0.3 10*3/uL (ref 0.1–1.0)
Monocytes Relative: 10 %
Neutro Abs: 1.2 10*3/uL — ABNORMAL LOW (ref 1.7–7.7)
Neutrophils Relative %: 39 %
Platelet Count: 252 10*3/uL (ref 150–400)
RBC: 5.02 MIL/uL (ref 3.87–5.11)
RDW: 12.4 % (ref 11.5–15.5)
WBC Count: 3.2 10*3/uL — ABNORMAL LOW (ref 4.0–10.5)
nRBC: 0 % (ref 0.0–0.2)

## 2020-12-23 LAB — HIV ANTIBODY (ROUTINE TESTING W REFLEX): HIV Screen 4th Generation wRfx: NONREACTIVE

## 2020-12-23 LAB — FOLATE: Folate: 23.3 ng/mL (ref >5.9)

## 2020-12-23 LAB — SAVE SMEAR(SSMR), FOR PROVIDER SLIDE REVIEW

## 2020-12-23 LAB — HEPATITIS C ANTIBODY: HCV Ab: NONREACTIVE

## 2020-12-23 LAB — HEPATITIS B CORE ANTIBODY, TOTAL: Hep B Core Total Ab: NONREACTIVE

## 2020-12-23 LAB — VITAMIN B12: Vitamin B-12: 923 pg/mL — ABNORMAL HIGH (ref 180–914)

## 2020-12-23 LAB — HEPATITIS B SURFACE ANTIGEN: Hepatitis B Surface Ag: NONREACTIVE

## 2020-12-23 LAB — HEPATITIS B SURFACE ANTIBODY,QUALITATIVE: Hep B S Ab: NONREACTIVE

## 2020-12-23 NOTE — Progress Notes (Signed)
[facility] [phone]   Fax:([phone]) [place] NOTE  Patient Care Team: Shirline Frees, MD as PCP - General (Family Medicine)  Hematological/Oncological History 1) 12/10/2020: WBC 2.8 (L), Hgb 14.0, Plt 250, ANC 0.9 (L)  2) 12/23/2020: Establish care with Dede Query PA-C  CHIEF COMPLAINTS/PURPOSE OF CONSULTATION:  "Neutropenia "  HISTORY OF PRESENTING ILLNESS:  [name] 65 y.o. female with medical history significant for diabetes, hypertension, hyperlipidemia, GERD, multinodular goiter.  On exam today, [name] reports chronic fatigue after exertion. She continues to complete her daily activities on her own. She denies any changes to her appetite and does not have any dietary restrictions.  Patient reports occasional episodes of nausea without any vomiting episodes.  She denies any abdominal pain has regular bowel movements.  Patient denies any easy bruising or signs of bleeding.  Patient has shortness of breath with exertion but not at rest.  She fevers, chills, night sweats, chest pain, cough, edema or peripheral neuropathy.  She has no other complaints.  Rest of the 10 point ROS is below.  MEDICAL HISTORY:  Past Medical History:  Diagnosis Date  . Diabetes ([place])   . GERD (gastroesophageal reflux disease)   . Goiter, nodular   . Hyperlipidemia   . Hypertension     SURGICAL HISTORY: Past Surgical History:  Procedure Laterality Date  . APPENDECTOMY  1979  . LIPOMA EXCISION     right shoulder  . PARTIAL HYSTERECTOMY     fibroids    SOCIAL HISTORY: Social History   Socioeconomic History  . Marital status: Single    Spouse name: Not on file  . Number of children: Not on file  . Years of education: Not on file  . Highest education level: Not on file  Occupational History  . Not on file  Tobacco Use  . Smoking status: Never Smoker  . Smokeless tobacco: Never Used  Substance and Sexual Activity  . Alcohol use:  Not Currently  . Drug use: Never  . Sexual activity: Not on file  Other Topics Concern  . Not on file  Social History Narrative  . Not on file   Social Determinants of Health   Financial Resource Strain: Not on file  Food Insecurity: Not on file  Transportation Needs: Not on file  Physical Activity: Not on file  Stress: Not on file  Social Connections: Not on file  Intimate Partner Violence: Not on file    FAMILY HISTORY: Family History  Problem Relation Age of Onset  . Prostate cancer Maternal Grandfather   . Breast cancer Neg Hx     ALLERGIES:  has no allergies on file.  MEDICATIONS:  Current Outpatient Medications  Medication Sig Dispense Refill  . amLODipine-benazepril (LOTREL) 10-40 MG capsule Take 1 tablet by mouth daily.    . Cholecalciferol 25 MCG (1000 UT) capsule 1 capsule    . Coenzyme Q-10 100 MG capsule 200 mg.    . Digestive Enzymes CAPS See admin instructions.    . Multiple Vitamins-Minerals (MULTIVITAMIN ADULTS 50+) TABS See admin instructions.    . vitamin C (ASCORBIC ACID) 500 MG tablet 1 tablet     No current facility-administered medications for this visit.    REVIEW OF SYSTEMS:   Constitutional: ( - ) fevers, ( - )  chills , ( - ) night sweats Eyes: ( - ) blurriness of vision, ( - ) double vision, ( - ) watery eyes Ears, nose, mouth, throat, and face: ( - )  mucositis, ( - ) sore throat Respiratory: ( - ) cough, ( - ) dyspnea, ( - ) wheezes Cardiovascular: ( - ) palpitation, ( - ) chest discomfort, ( - ) lower extremity swelling Gastrointestinal:  ( - ) nausea, ( - ) heartburn, ( - ) change in bowel habits Skin: ( - ) abnormal skin rashes Lymphatics: ( - ) new lymphadenopathy, ( - ) easy bruising Neurological: ( - ) numbness, ( - ) tingling, ( - ) new weaknesses Behavioral/Psych: ( - ) mood change, ( - ) new changes  All other systems were reviewed with the patient and are negative.  PHYSICAL EXAMINATION: ECOG PERFORMANCE STATUS: 0 -  Asymptomatic  Vitals:   12/23/20 1423  BP: (!) 155/74  Pulse: 79  Resp: 18  Temp: 97.6 F (36.4 C)  SpO2: 97%   Filed Weights   12/23/20 1423  Weight: 202 lb 14.4 oz (92 kg)    GENERAL: well appearing African American female in NAD  SKIN: skin color, texture, turgor are normal, no rashes or significant lesions EYES: conjunctiva are pink and non-injected, sclera clear OROPHARYNX: no exudate, no erythema; lips, buccal mucosa, and tongue normal  NECK: supple, non-tender LYMPH:  no palpable lymphadenopathy in the cervical, axillary or supraclavicular lymph nodes.  LUNGS: clear to auscultation and percussion with normal breathing effort HEART: regular rate & rhythm and no murmurs and no lower extremity edema ABDOMEN: soft, non-tender, non-distended, normal bowel sounds Musculoskeletal: no cyanosis of digits and no clubbing  PSYCH: alert & oriented x 3, fluent speech NEURO: no focal motor/sensory deficits  LABORATORY DATA:  I have reviewed the data as listed CBC Latest Ref Rng & Units 12/23/2020  WBC 4.0 - 10.5 K/uL 3.2(L)  Hemoglobin 12.0 - 15.0 g/dL 14.9  Hematocrit 36.0 - 46.0 % 43.8  Platelets 150 - 400 K/uL 252    CMP Latest Ref Rng & Units 12/23/2020  Glucose 70 - 99 mg/dL 102(H)  BUN 8 - 23 mg/dL 24(H)  Creatinine 0.44 - 1.00 mg/dL 1.00  Sodium 135 - 145 mmol/L 141  Potassium 3.5 - 5.1 mmol/L 4.3  Chloride 98 - 111 mmol/L 105  CO2 22 - 32 mmol/L 25  Calcium 8.9 - 10.3 mg/dL 9.8  Total Protein 6.5 - 8.1 g/dL 8.6(H)  Total Bilirubin 0.3 - 1.2 mg/dL 0.7  Alkaline Phos 38 - 126 U/L 43  AST 15 - 41 U/L 24  ALT 0 - 44 U/L 20   ASSESSMENT & PLAN [name] is a 65 y.o. 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   The differential for neutropenia is benign ethnic neutropenia, infectious etiology, nutritional etiology, inflammatory etiology, or medication.  The patient is not currently taking any medications know  to cause neutropenia.  We will order viral studies with hep B, hep C, and HIV as well.  In addition, we will check for nutritional anemias with iron, B12 and folate levels.   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  This is a common condition whereby individuals of African or Mediterranean descent tend to have slightly lower white blood cell counts in the general population.  Hallmark of the syndrome is an [place] between 1000 and 1500, which has little to no clinical consequence.  Unfortunately, this is a diagnosis of exclusion and therefore we will do further work-up in order to assure there is no other etiology.  Assuming none of the studies show any concerning abnormalities we will see  the patient back in 6 months time in order to continue to monitor.   # Leukopenia/Neutropenia --Rule out infectious etiology with hepatitis B serologies, hepatitis C serologies, and HIV. --Nutritional evaluation with vitamin B12 and folate. --Repeat CBC and CMP.  Additionally we will order ESR and a peripheral blood film. --Assuming there are no concerning findings on her blood work-up today we will plan to see the patient back in approximately 6 months time.   Orders Placed This Encounter  Procedures  . CBC with Differential ([facility] Only)    Standing Status:   Future    Number of Occurrences:   1    Standing Expiration Date:   12/23/2021  . CMP ([facility] only)    Standing Status:   Future    Number of Occurrences:   1    Standing Expiration Date:   12/23/2021  . Folate, Serum    Standing Status:   Future    Number of Occurrences:   1    Standing Expiration Date:   12/23/2021  . Vitamin B12    Standing Status:   Future    Number of Occurrences:   1    Standing Expiration Date:   12/23/2021  . Hepatitis B core antibody, total    Standing Status:   Future    Number of Occurrences:   1    Standing Expiration Date:   12/23/2021  . Hepatitis B surface antibody    Standing  Status:   Future    Number of Occurrences:   1    Standing Expiration Date:   12/23/2021  . Hepatitis B surface antigen    Standing Status:   Future    Number of Occurrences:   1    Standing Expiration Date:   12/23/2021  . Hepatitis C antibody    Standing Status:   Future    Number of Occurrences:   1    Standing Expiration Date:   12/23/2021  . HIV antibody (with reflex)    Standing Status:   Future    Number of Occurrences:   1    Standing Expiration Date:   12/23/2021  . Save Smear ([facility])    Standing Status:   Future    Number of Occurrences:   1    Standing Expiration Date:   12/23/2021    All questions were answered. The patient knows to call the clinic with any problems, questions or concerns.  A total of more than 60 minutes were spent on this encounter and over half of that time was spent on counseling and coordination of care as outlined above.    Dede Query, PA-C Department of Hematology/Oncology [facility] Phone: [phone]

## 2020-12-25 DIAGNOSIS — D709 Neutropenia, unspecified: Secondary | ICD-10-CM | POA: Insufficient documentation

## 2021-01-06 ENCOUNTER — Telehealth: Payer: Self-pay | Admitting: Physician Assistant

## 2021-01-06 NOTE — Telephone Encounter (Signed)
Scheduled per los. Called and left msg. Mailed printout  °

## 2021-03-30 DIAGNOSIS — H903 Sensorineural hearing loss, bilateral: Secondary | ICD-10-CM | POA: Diagnosis not present

## 2021-06-10 DIAGNOSIS — N1831 Chronic kidney disease, stage 3a: Secondary | ICD-10-CM | POA: Diagnosis not present

## 2021-06-10 DIAGNOSIS — D708 Other neutropenia: Secondary | ICD-10-CM | POA: Diagnosis not present

## 2021-06-10 DIAGNOSIS — E7800 Pure hypercholesterolemia, unspecified: Secondary | ICD-10-CM | POA: Diagnosis not present

## 2021-06-10 DIAGNOSIS — E119 Type 2 diabetes mellitus without complications: Secondary | ICD-10-CM | POA: Diagnosis not present

## 2021-06-10 DIAGNOSIS — I10 Essential (primary) hypertension: Secondary | ICD-10-CM | POA: Diagnosis not present

## 2021-06-10 DIAGNOSIS — E042 Nontoxic multinodular goiter: Secondary | ICD-10-CM | POA: Diagnosis not present

## 2021-06-14 ENCOUNTER — Other Ambulatory Visit: Payer: Self-pay | Admitting: Family Medicine

## 2021-06-14 DIAGNOSIS — K219 Gastro-esophageal reflux disease without esophagitis: Secondary | ICD-10-CM

## 2021-06-22 ENCOUNTER — Other Ambulatory Visit: Payer: Self-pay

## 2021-06-22 ENCOUNTER — Ambulatory Visit
Admission: RE | Admit: 2021-06-22 | Discharge: 2021-06-22 | Disposition: A | Discharge status: Home / Self Care | Payer: Federal, State, Local not specified - PPO | Source: Ambulatory Visit | Attending: Family Medicine | Admitting: Family Medicine

## 2021-06-22 ENCOUNTER — Other Ambulatory Visit: Payer: Self-pay | Admitting: Family Medicine

## 2021-06-22 DIAGNOSIS — K219 Gastro-esophageal reflux disease without esophagitis: Secondary | ICD-10-CM

## 2021-06-22 DIAGNOSIS — R1310 Dysphagia, unspecified: Secondary | ICD-10-CM | POA: Diagnosis not present

## 2021-06-23 ENCOUNTER — Other Ambulatory Visit: Payer: Federal, State, Local not specified - PPO

## 2021-06-23 ENCOUNTER — Ambulatory Visit: Payer: Federal, State, Local not specified - PPO | Admitting: Physician Assistant

## 2021-06-23 ENCOUNTER — Other Ambulatory Visit: Payer: Self-pay | Admitting: Physician Assistant

## 2021-06-23 DIAGNOSIS — D708 Other neutropenia: Secondary | ICD-10-CM

## 2021-07-05 ENCOUNTER — Other Ambulatory Visit: Payer: Self-pay | Admitting: Family Medicine

## 2021-07-05 DIAGNOSIS — Z1231 Encounter for screening mammogram for malignant neoplasm of breast: Secondary | ICD-10-CM

## 2021-08-11 ENCOUNTER — Ambulatory Visit
Admission: RE | Admit: 2021-08-11 | Discharge: 2021-08-11 | Disposition: A | Discharge status: Home / Self Care | Payer: Federal, State, Local not specified - PPO | Source: Ambulatory Visit | Attending: Family Medicine | Admitting: Family Medicine

## 2021-08-11 DIAGNOSIS — Z1231 Encounter for screening mammogram for malignant neoplasm of breast: Secondary | ICD-10-CM | POA: Diagnosis not present

## 2021-09-17 DIAGNOSIS — Z20822 Contact with and (suspected) exposure to covid-19: Secondary | ICD-10-CM | POA: Diagnosis not present

## 2021-12-17 DIAGNOSIS — I10 Essential (primary) hypertension: Secondary | ICD-10-CM | POA: Diagnosis not present

## 2021-12-17 DIAGNOSIS — E119 Type 2 diabetes mellitus without complications: Secondary | ICD-10-CM | POA: Diagnosis not present

## 2021-12-17 DIAGNOSIS — E7800 Pure hypercholesterolemia, unspecified: Secondary | ICD-10-CM | POA: Diagnosis not present

## 2021-12-17 DIAGNOSIS — L989 Disorder of the skin and subcutaneous tissue, unspecified: Secondary | ICD-10-CM | POA: Diagnosis not present

## 2022-03-25 DIAGNOSIS — R7309 Other abnormal glucose: Secondary | ICD-10-CM | POA: Diagnosis not present

## 2022-06-28 ENCOUNTER — Other Ambulatory Visit: Payer: Self-pay | Admitting: Family Medicine

## 2022-06-28 DIAGNOSIS — E2839 Other primary ovarian failure: Secondary | ICD-10-CM

## 2022-06-29 ENCOUNTER — Other Ambulatory Visit: Payer: Self-pay | Admitting: Family Medicine

## 2022-06-29 DIAGNOSIS — Z1231 Encounter for screening mammogram for malignant neoplasm of breast: Secondary | ICD-10-CM

## 2022-08-30 ENCOUNTER — Ambulatory Visit
Admission: RE | Admit: 2022-08-30 | Discharge: 2022-08-30 | Disposition: A | Discharge status: Home / Self Care | Payer: Federal, State, Local not specified - PPO | Source: Ambulatory Visit | Attending: Family Medicine | Admitting: Family Medicine

## 2022-08-30 DIAGNOSIS — Z1231 Encounter for screening mammogram for malignant neoplasm of breast: Secondary | ICD-10-CM

## 2022-10-11 IMAGING — RF DG ESOPHAGUS
8 of 10 series · 14 of 24 positions shown · non-contrast
Comparison: None.

CLINICAL DATA: Gastroesophageal reflux without reported
esophagitis.

EXAM:
ESOPHOGRAM / BARIUM SWALLOW / BARIUM TABLET STUDY
TECHNIQUE: Combined double contrast and single contrast examination performed
using effervescent [HOSPITAL]s, thick barium liquid, and thin barium
liquid. The patient was observed with fluoroscopy swallowing a 13 mm
barium sulphate tablet.
FLUOROSCOPY TIME:  Fluoroscopy Time:  1 minute 42 seconds
Radiation Exposure Index (if provided by the fluoroscopic device):
30 mGy
Number of Acquired Spot Images: 0

[Series 1: sequence · 2 of 9 frames shown (1 of 4)]
[frame 2/9]
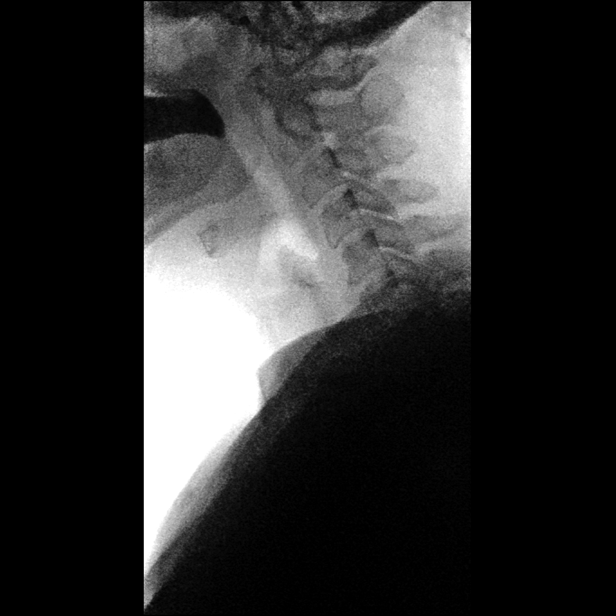
[frame 8/9]
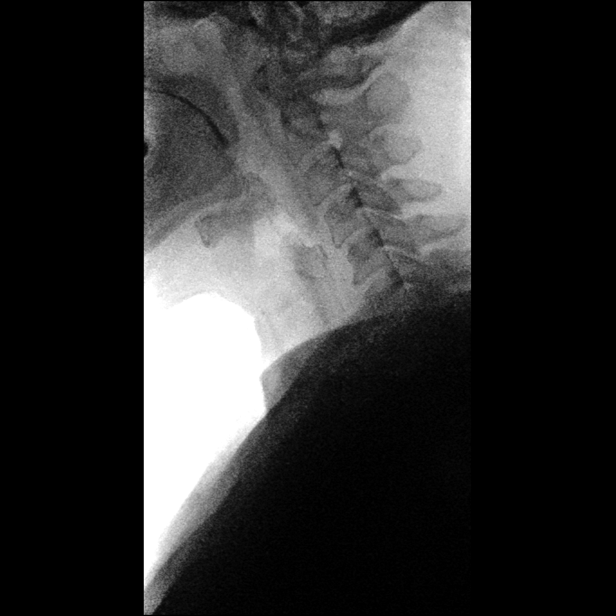

[Series 2: sequence · 1 of 37 frames shown (2 of 4)]
[frame 23/37]
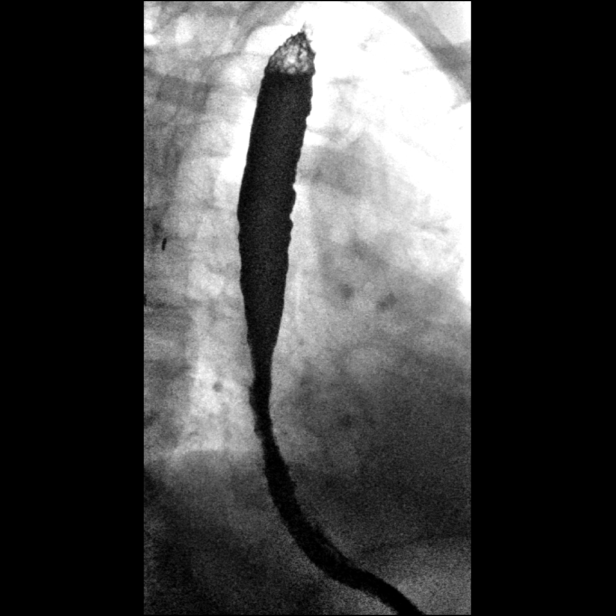

[Series 3: one shot · 2 of 4 slices shown (1 of 4)]
[im 2/4]
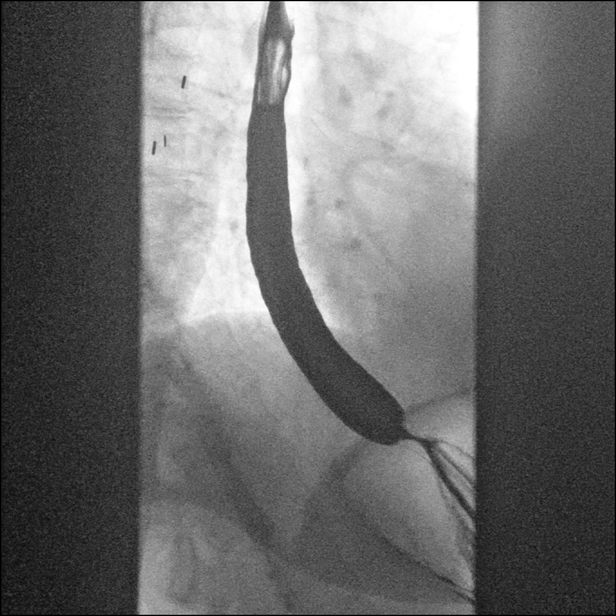
[im 4/4]
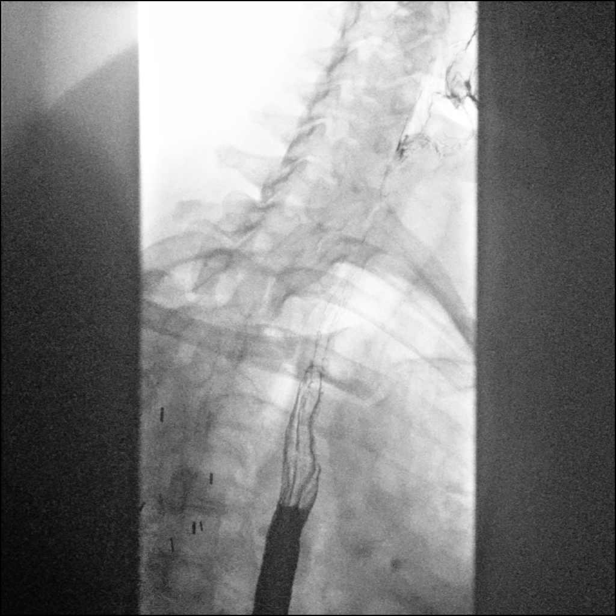

[Series 4: sequence · 1 of 11 frames shown (3 of 4)]
[frame 7/11]
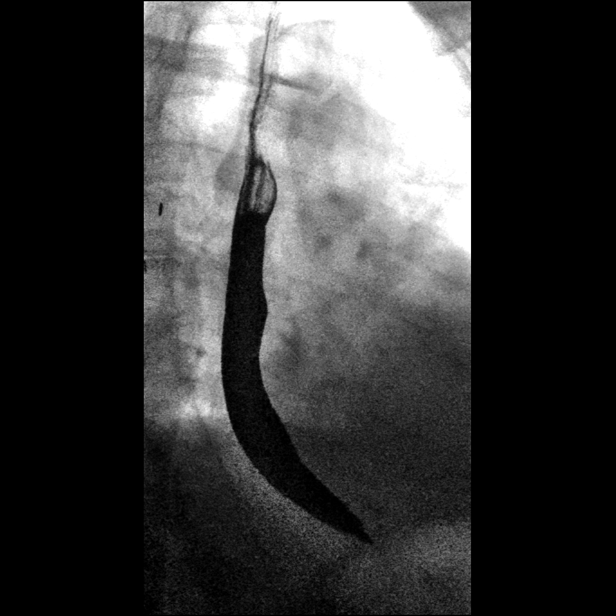

[Series 5: sequence · 1 of 3 frames shown (4 of 4)]
[frame 2/3]
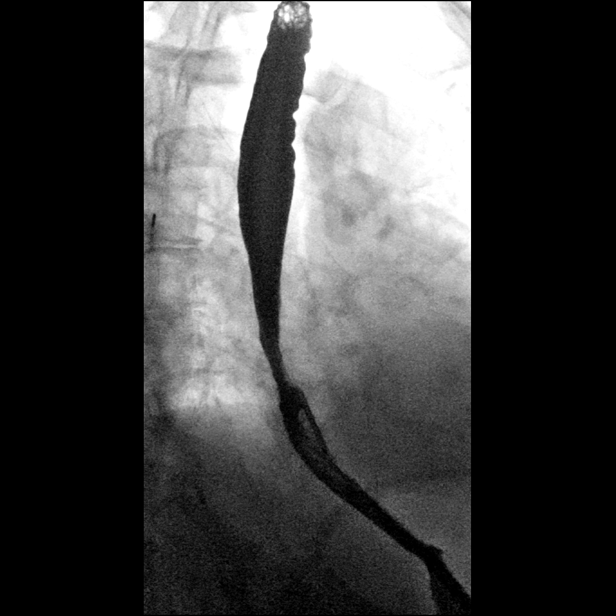

[Series 6: one shot · 1 of 1 slices shown (2 of 4)]
[im 1/1]
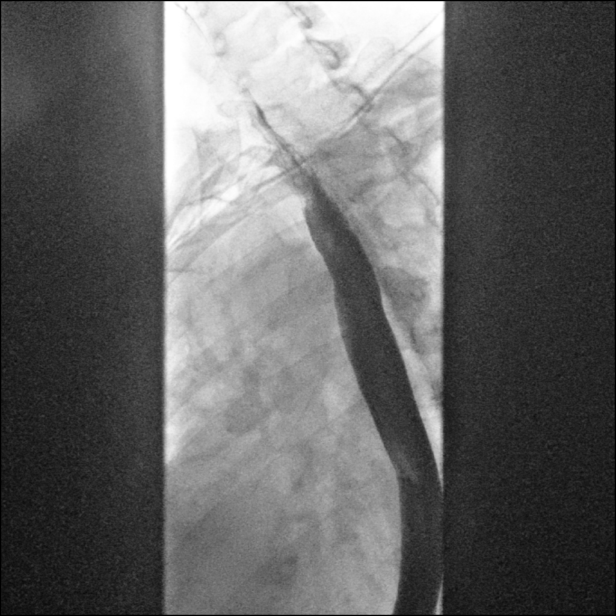

[Series 8: one shot · 2 of 6 slices shown (3 of 4)]
[im 2/6]
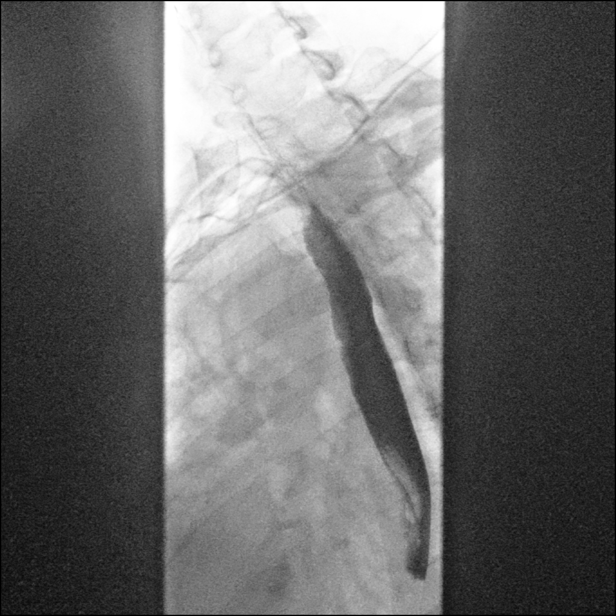
[im 6/6]
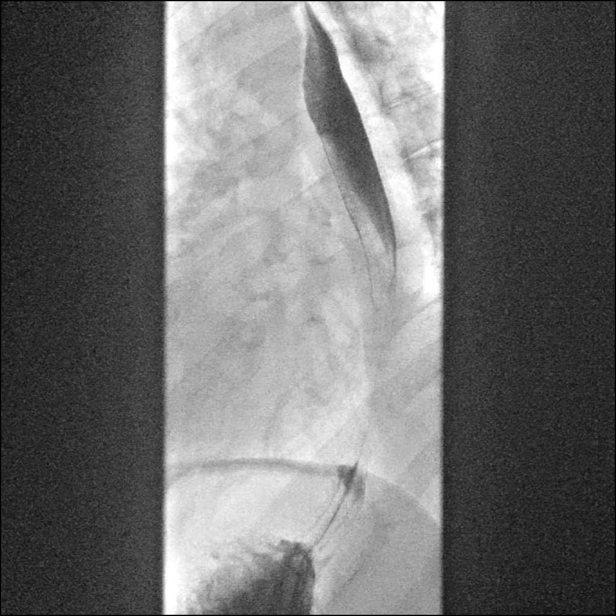

[Series 10: one shot · 4 of 11 slices shown (4 of 4)]
[im 2/11]
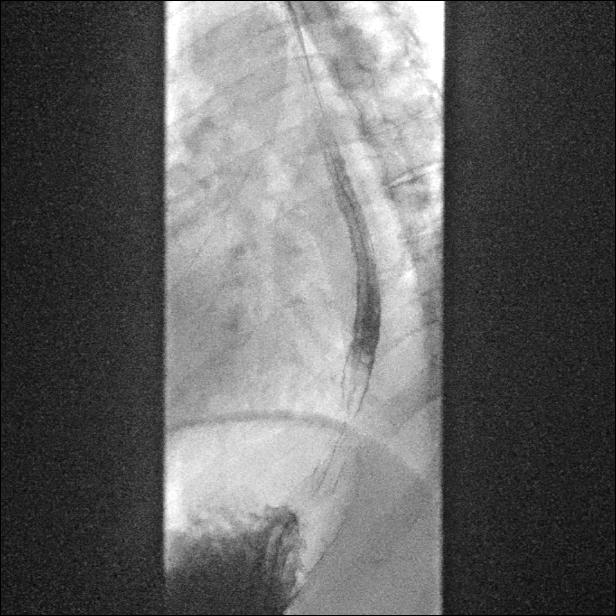
[im 3/11]
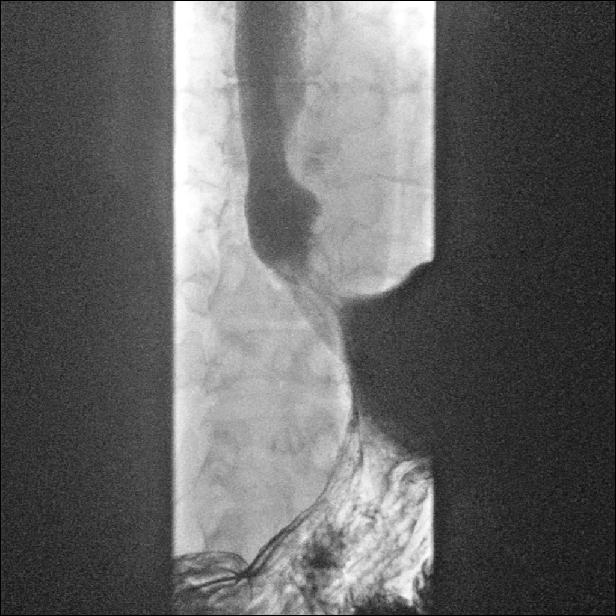
[im 7/11]
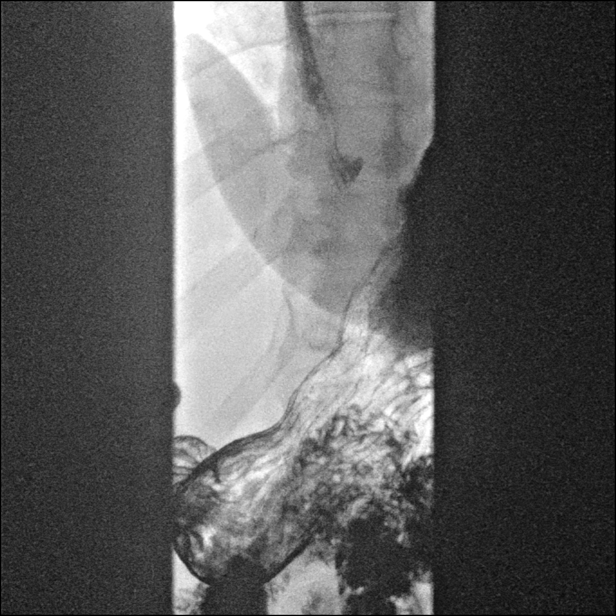
[im 11/11]
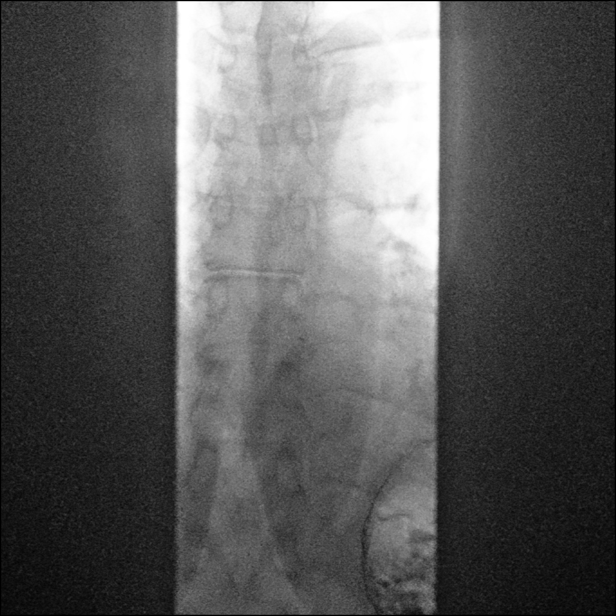

[14 of 24 positions shown; findings below may reference images not displayed]

FINDINGS: Single swallow in lateral projection without gross swallow
dysfunction.

Imaging and LPO following effervescent [HOSPITAL] administration and
with thick barium shows normal distensibility of the esophagus. No
fixed narrowing. Signs of esophageal fold thickening.

Prone RAO positioning with single swallow shows intact primary wave
and no visible tertiary peristaltic activity.

Full column assessment with tiny hiatal hernia. No esophageal
narrowing.

During repositioning the patient had abundant reflux to the level of
the proximal thoracic esophagus to the level of thoracic inlet.
IMPRESSION: No esophageal narrowing.

Signs of esophageal fold thickening suggesting esophagitis.

Marked gastroesophageal reflux to the level of the proximal thoracic
esophagus to the level of the thoracic inlet.

## 2022-12-15 ENCOUNTER — Ambulatory Visit
Admission: RE | Admit: 2022-12-15 | Discharge: 2022-12-15 | Disposition: A | Discharge status: Home / Self Care | Payer: Federal, State, Local not specified - PPO | Source: Ambulatory Visit | Attending: Family Medicine | Admitting: Family Medicine

## 2022-12-15 DIAGNOSIS — E2839 Other primary ovarian failure: Secondary | ICD-10-CM

## 2024-08-28 ENCOUNTER — Other Ambulatory Visit: Payer: Self-pay | Admitting: Family Medicine

## 2024-08-28 DIAGNOSIS — Z1231 Encounter for screening mammogram for malignant neoplasm of breast: Secondary | ICD-10-CM

## 2024-09-19 ENCOUNTER — Ambulatory Visit
